# Patient Record
Sex: Female | Born: 1955 | Hispanic: No | Marital: Married | Smoking: Former smoker
Health system: Southern US, Community
[De-identification: ages and names within clinical notes are randomized; demographics above are authoritative.]

## PROBLEM LIST (undated history)

## (undated) DIAGNOSIS — M1990 Unspecified osteoarthritis, unspecified site: Secondary | ICD-10-CM

## (undated) DIAGNOSIS — E119 Type 2 diabetes mellitus without complications: Secondary | ICD-10-CM

## (undated) DIAGNOSIS — I10 Essential (primary) hypertension: Secondary | ICD-10-CM

## (undated) DIAGNOSIS — H269 Unspecified cataract: Secondary | ICD-10-CM

## (undated) HISTORY — PX: WISDOM TOOTH EXTRACTION: SHX21

## (undated) HISTORY — DX: Type 2 diabetes mellitus without complications: E11.9

## (undated) HISTORY — PX: DILATION AND CURETTAGE OF UTERUS: SHX78

## (undated) HISTORY — DX: Unspecified cataract: H26.9

## (undated) HISTORY — DX: Unspecified osteoarthritis, unspecified site: M19.90

## (undated) HISTORY — DX: Essential (primary) hypertension: I10

---

## 1999-05-31 ENCOUNTER — Other Ambulatory Visit: Admission: RE | Admit: 1999-05-31 | Discharge: 1999-05-31 | Payer: Self-pay | Admitting: Obstetrics and Gynecology

## 2000-09-04 ENCOUNTER — Other Ambulatory Visit: Admission: RE | Admit: 2000-09-04 | Discharge: 2000-09-04 | Payer: Self-pay | Admitting: Obstetrics and Gynecology

## 2001-09-15 ENCOUNTER — Other Ambulatory Visit: Admission: RE | Admit: 2001-09-15 | Discharge: 2001-09-15 | Payer: Self-pay | Admitting: Obstetrics and Gynecology

## 2002-10-02 ENCOUNTER — Other Ambulatory Visit: Admission: RE | Admit: 2002-10-02 | Discharge: 2002-10-02 | Payer: Self-pay | Admitting: Obstetrics and Gynecology

## 2003-10-13 ENCOUNTER — Other Ambulatory Visit: Admission: RE | Admit: 2003-10-13 | Discharge: 2003-10-13 | Payer: Self-pay | Admitting: Obstetrics and Gynecology

## 2005-01-11 ENCOUNTER — Other Ambulatory Visit: Admission: RE | Admit: 2005-01-11 | Discharge: 2005-01-11 | Payer: Self-pay | Admitting: Obstetrics and Gynecology

## 2006-04-04 ENCOUNTER — Other Ambulatory Visit: Admission: RE | Admit: 2006-04-04 | Discharge: 2006-04-04 | Payer: Self-pay | Admitting: Obstetrics and Gynecology

## 2007-12-25 HISTORY — PX: OTHER SURGICAL HISTORY: SHX169

## 2008-01-06 ENCOUNTER — Encounter (INDEPENDENT_AMBULATORY_CARE_PROVIDER_SITE_OTHER): Payer: Self-pay | Admitting: Obstetrics and Gynecology

## 2008-01-06 ENCOUNTER — Ambulatory Visit (HOSPITAL_COMMUNITY): Admission: RE | Admit: 2008-01-06 | Discharge: 2008-01-07 | Payer: Self-pay | Admitting: Physical Therapy

## 2008-04-23 ENCOUNTER — Ambulatory Visit: Payer: Self-pay | Admitting: Internal Medicine

## 2008-04-23 HISTORY — PX: COLONOSCOPY: SHX174

## 2008-05-06 ENCOUNTER — Ambulatory Visit: Payer: Self-pay | Admitting: Internal Medicine

## 2008-10-20 IMAGING — CR DG IVP W/O TOMOGRAMS
5 series · 5 of 5 positions shown · IV contrast (omnipaque)
Comparison: none

CLINICAL DATA: Status post vaginal hysterectomy; left pelvic pain.
 IVP:
TECHNIQUE: 100 cc of Omnipaque.

[view not recorded (1 of 5)]
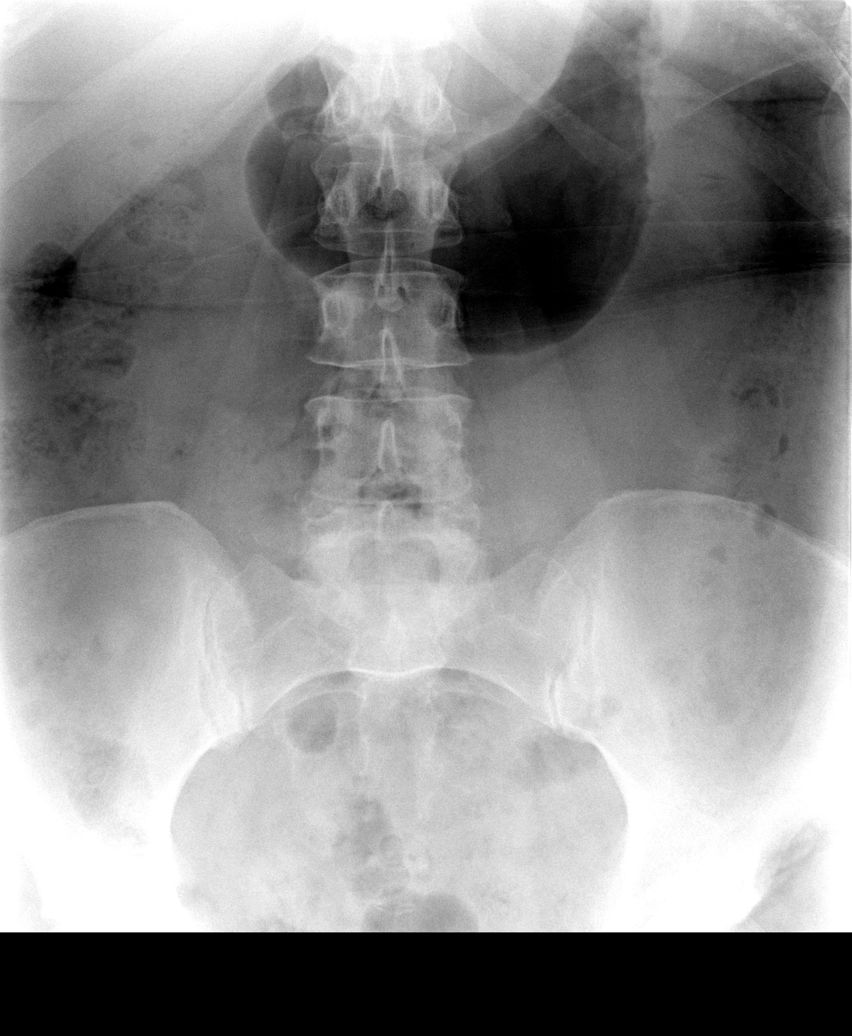

[view not recorded (2 of 5)]
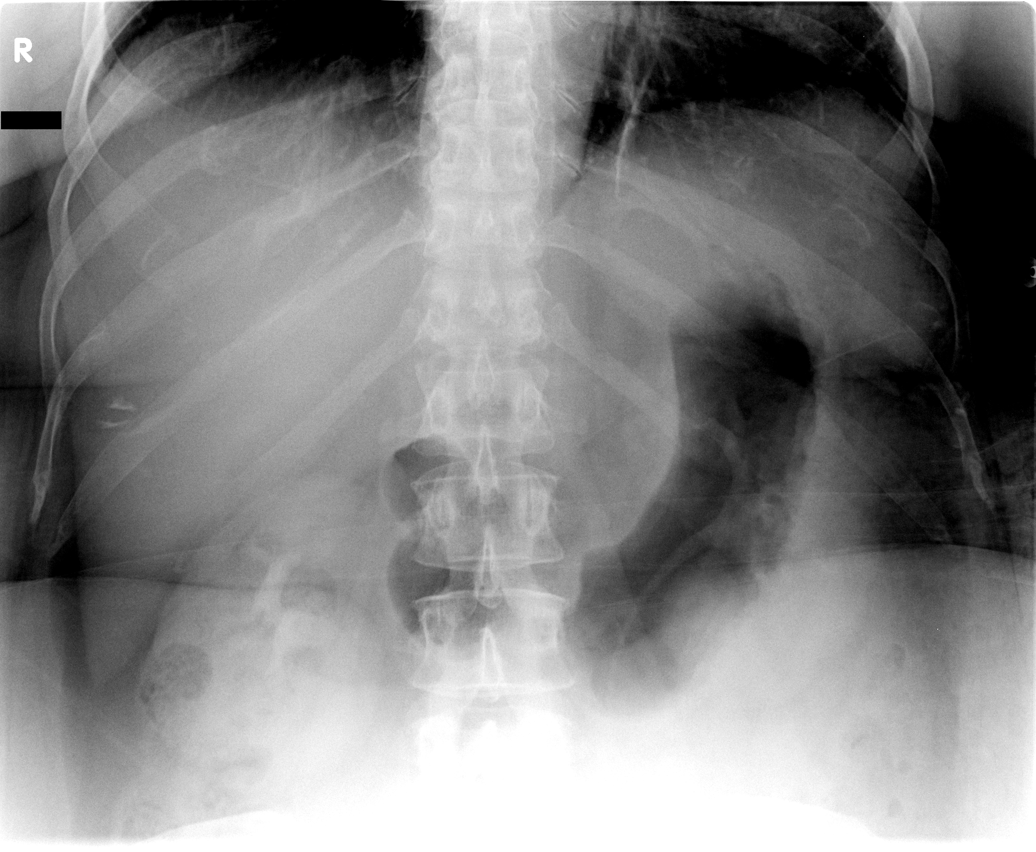

[view not recorded (3 of 5)]
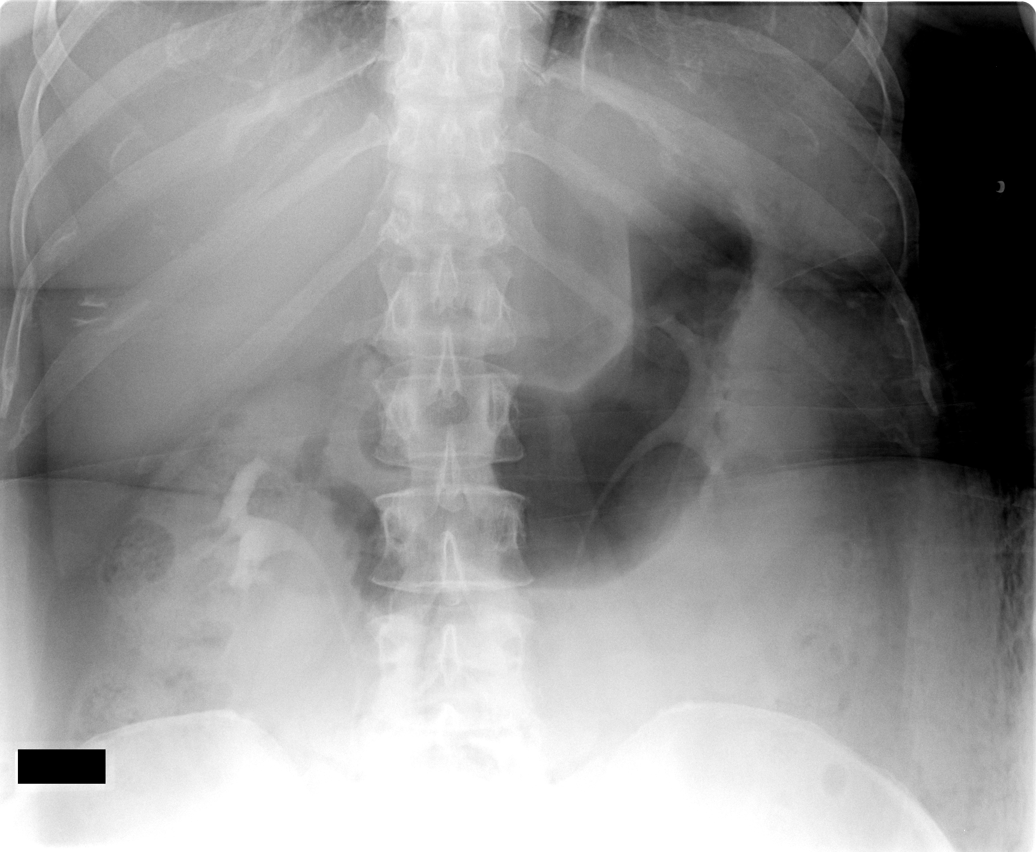

[view not recorded (4 of 5)]
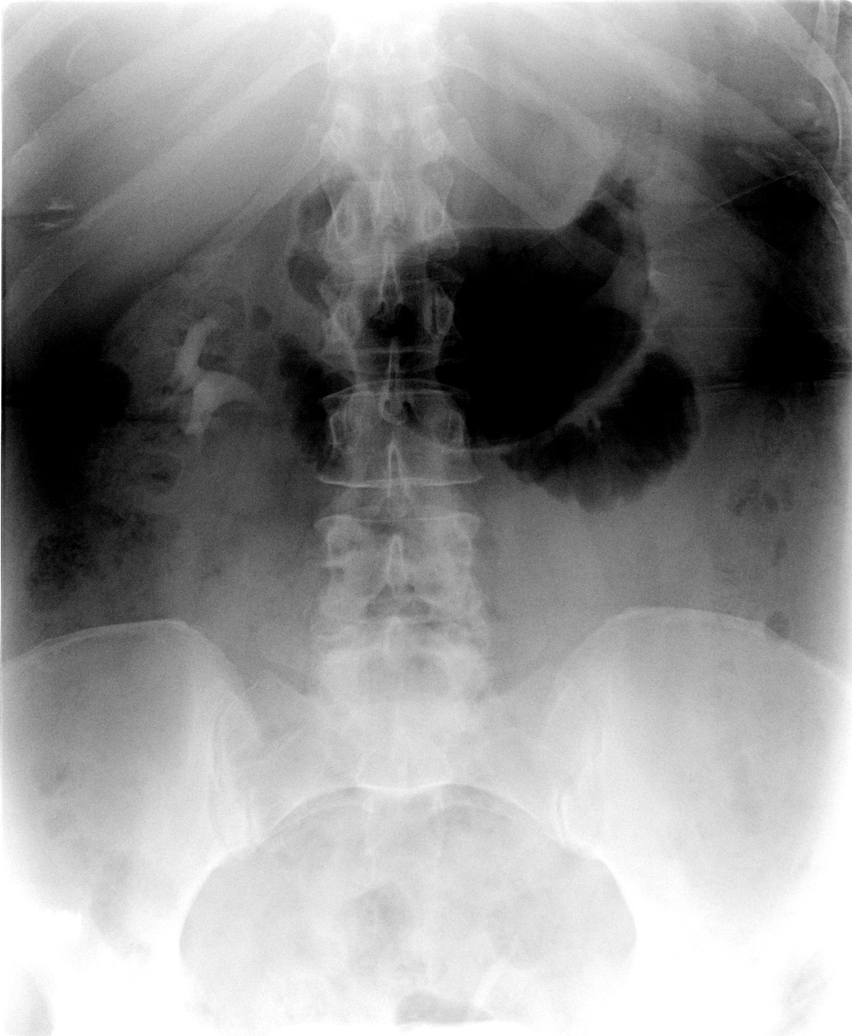

[view not recorded (5 of 5)]
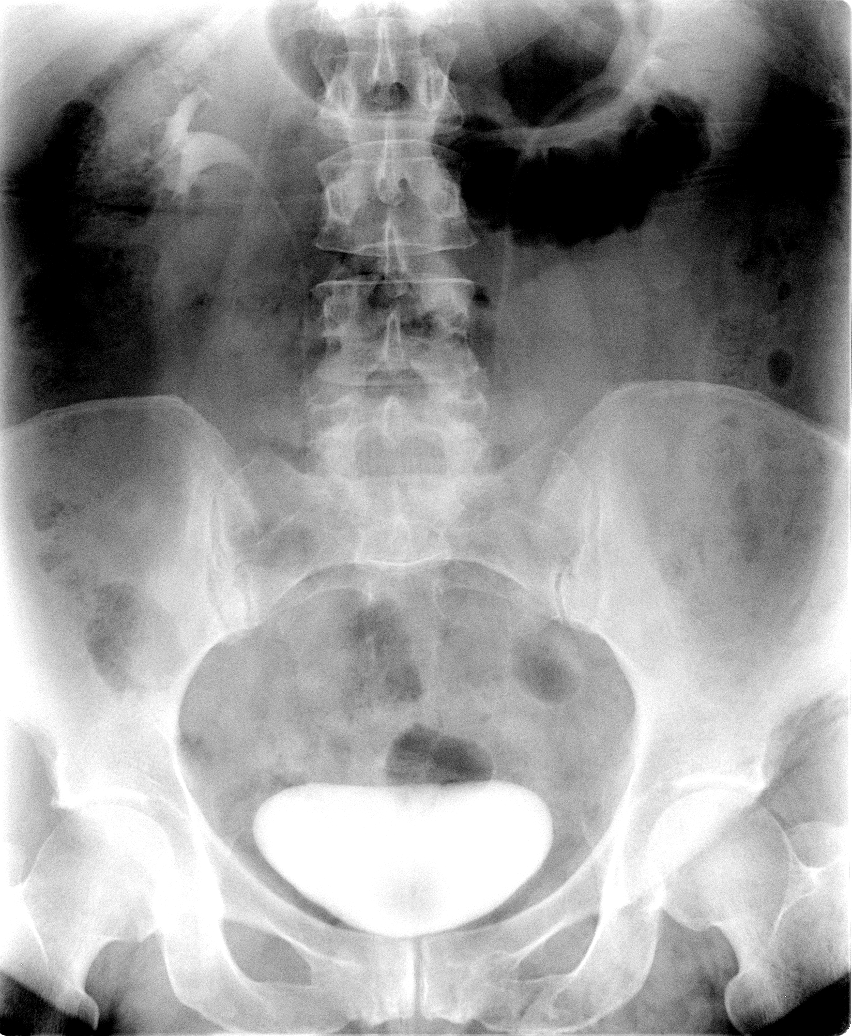

[5 of 5 positions shown; findings below may reference images not displayed]

FINDINGS: Scout film reveals normal stool and bowel gas pattern.  On the immediate film, prompt symmetric nephrograms are seen.  There is prompt excretion of contrast in both upper collecting systems.  The 5 and 10 minute delayed films also show symmetric renal pelves and segmentally visualized ureters with no evidence of dilatation.  No gross extravasation of contrast is seen.
IMPRESSION: Normal IVP with no evidence of gross obstruction or extravasation.

## 2009-12-02 ENCOUNTER — Encounter: Admission: RE | Admit: 2009-12-02 | Discharge: 2009-12-02 | Payer: Self-pay | Admitting: Obstetrics and Gynecology

## 2009-12-15 ENCOUNTER — Encounter: Admission: RE | Admit: 2009-12-15 | Discharge: 2009-12-15 | Payer: Self-pay | Admitting: Obstetrics and Gynecology

## 2010-09-29 IMAGING — MG MM BREAST STEREO BIOPSY*R*
2 series · 2 of 2 positions shown · non-contrast
Comparison: none

December 19, 2009 –DUPLICATE COPY for exam association in [facility]. No change from original report.
 Addendum Begins

 Addendum dictated by Dr. Lisa on 12/19/2009. The original report
 was dictated by Dr. Asenathi.
 The final pathological diagnosis is benign fibroadenomatoid
 proliferation associated with calcification. This is concordant
 with the imaging findings. The biopsy results were given to the
 patient and her husband on 12/19/2009. Their questions were
 answered. The biopsy site is clean and dry without bruising or
 palpable hematoma. The patient reported no residual pain at the
 biopsy site. Screening mammography is recommended in 1 year.
 Addendum Ends
CLINICAL DATA: Right breast microcalcifications.

[R CC]
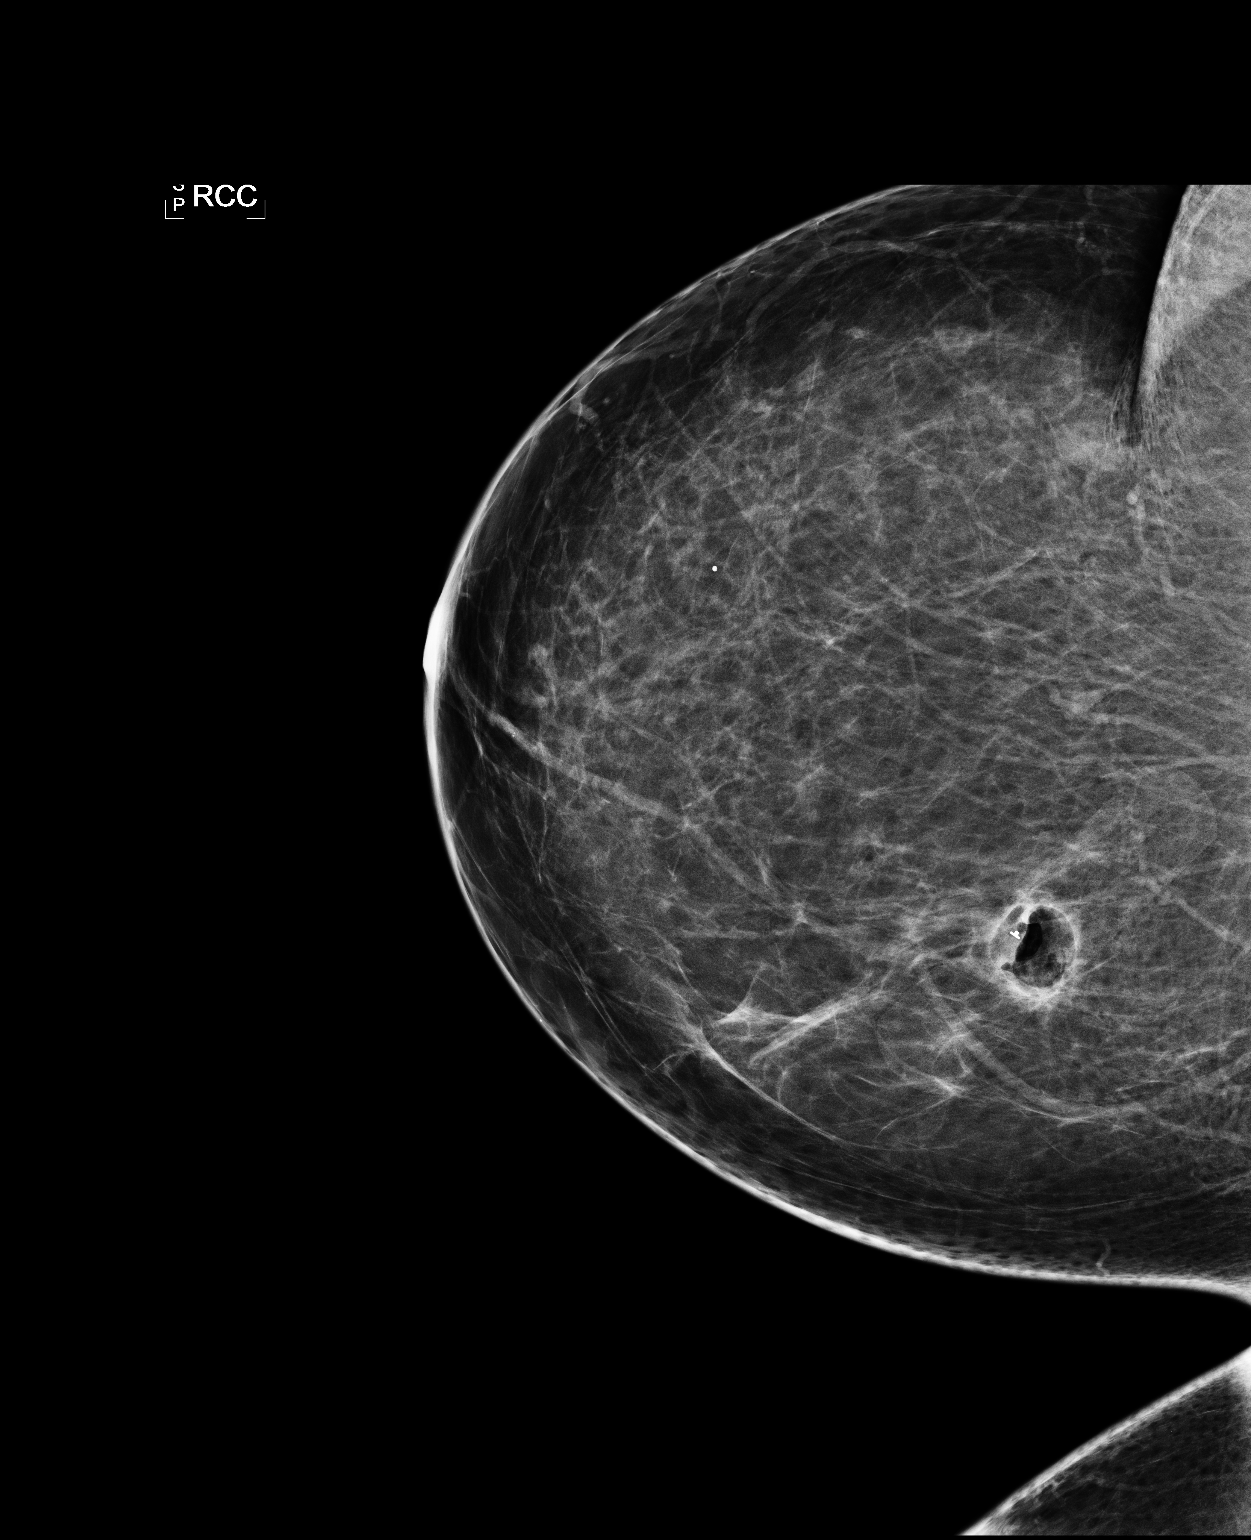

[R ML]
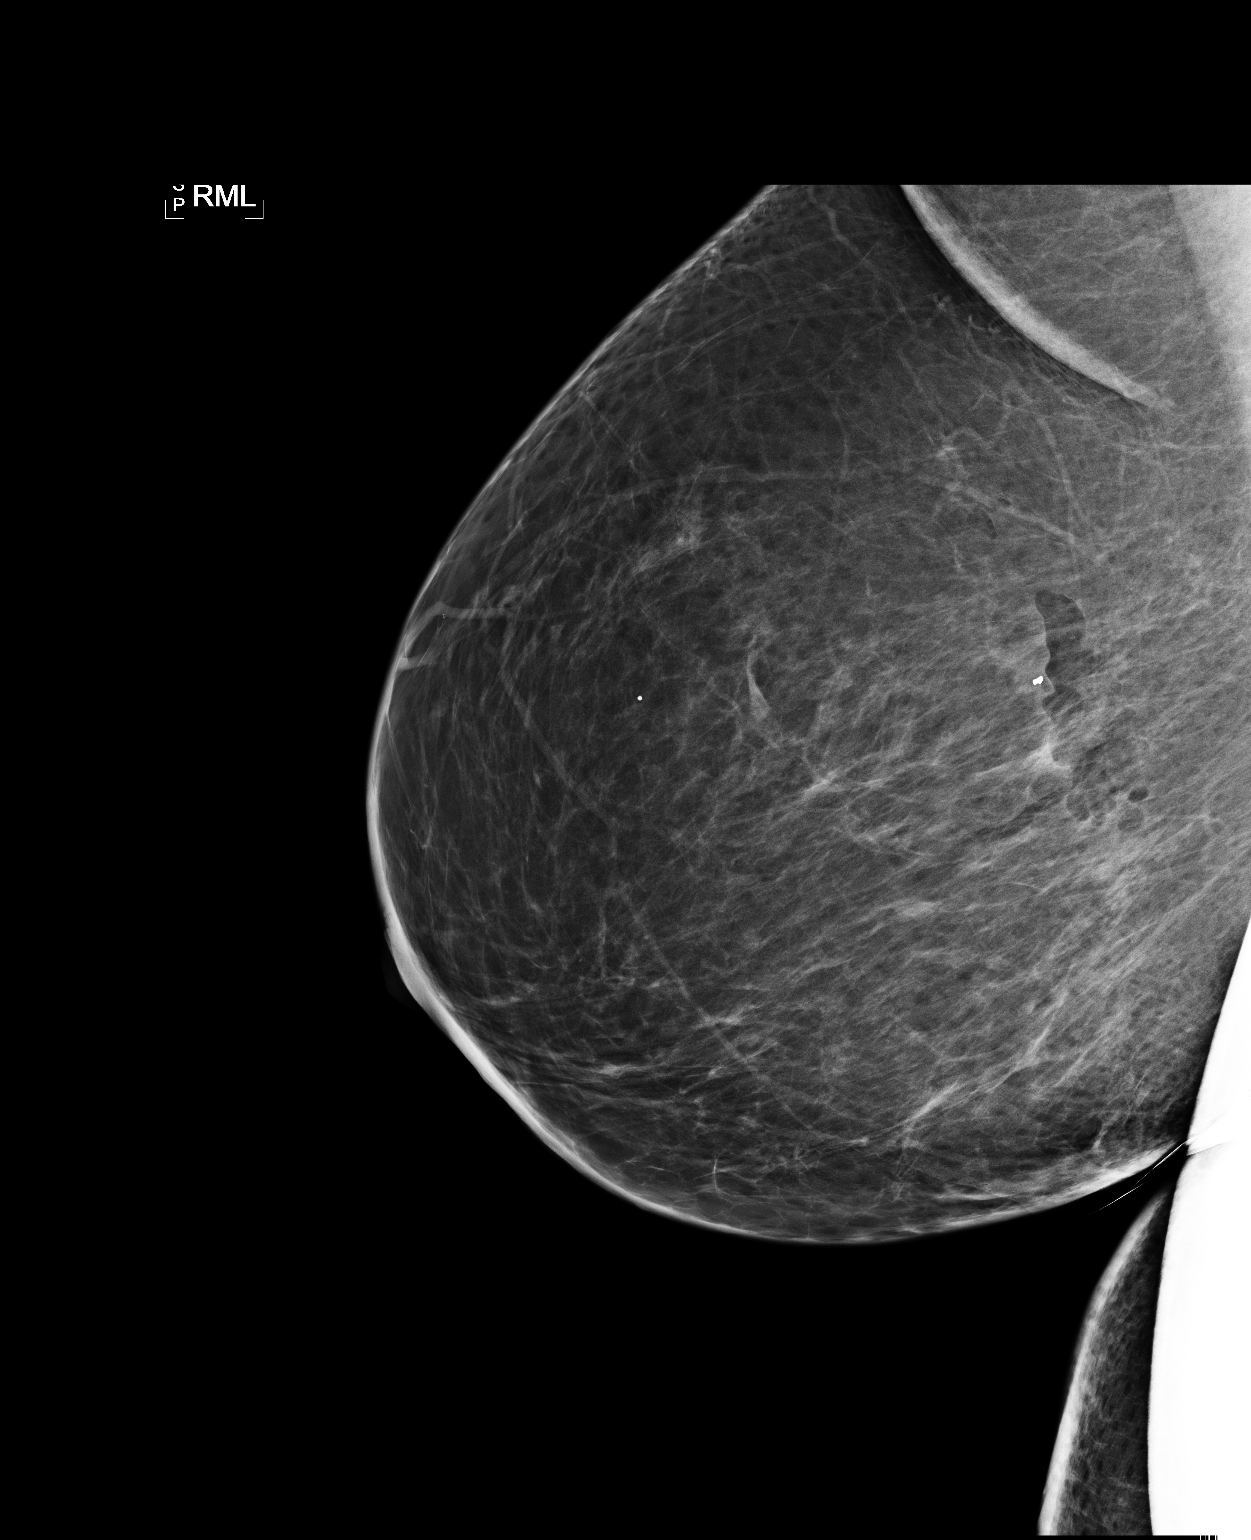

[2 of 2 positions shown; findings below may reference images not displayed]

STEREOTACTIC-GUIDED VACUUM ASSISTED BIOPSY OF THE RIGHT BREAST AND
 SPECIMEN RADIOGRAPH

 I met with the patient, and we discussed the procedure of
 stereotactic-guided biopsy, including risks, benefits, and
 alternatives. Specifically, we discussed the risks of infection,
 bleeding, tissue injury, clip migration, and inadequate sampling.
 Informed, written consent was given.

 Using sterile technique, 2% lidocaine, stereotactic guidance, and a
 9 gauge vacuum assisted device, biopsy was performed of the small
 cluster of calcifications located centrally and slightly medially
 within the right breast. Specimen radiograph was performed,
 showing multiple calcifications within the specimen. Specimens
 with calcifications are identified for pathology.

 At the conclusion of the procedure, a tissue marker clip was
 deployed into the biopsy cavity. Follow-up 2-view mammogram
 confirmed clip to be in satisfactory position.
IMPRESSION: Stereotactic-guided biopsy of right breast microcalcifications as
 discussed above. No apparent complications.

## 2011-05-08 NOTE — H&P (Signed)
NAME:  Norma Shaffer, Norma Shaffer NO.:  [id number]   MEDICAL RECORD NO.:  [id number]          PATIENT TYPE:  AMB   LOCATION:  SDC                           FACILITY:  [facility]   PHYSICIAN:  Dineen Kid. Rana Snare, M.D.    DATE OF BIRTH:  12/07/1956   DATE OF ADMISSION:  01/06/2008  DATE OF DISCHARGE:                              HISTORY & PHYSICAL   HISTORY OF PRESENT ILLNESS:  Norma Shaffer is a 55 year old G1, P1 with  problems with abnormal uterine bleeding and postmenopausal bleeding.  She had a recent hysteroscopy D&C for evaluation and removal of  endometrial polyp in September which showed simple hyperplasia without  atypia.  Subsequently was placed on Provera.  Her surgery was  uncomplicated.  After beginning on Provera, she began having irregular  and heavy bleeding, which really has not stopped since.  She has been  tried even on birth control pills, and it can slow with two birth  control pills at a time but otherwise she cannot stop her birth control  pills without resumption of the bleeding.  Had a recent follow-up saline  infusion ultrasound this January, which shows several fibroids, the  largest measuring 2.9 cm in size and some suspicion for adenomyosis and  some irregular endometrium, suspicious for endometrial clot.  She  desires definitive surgical intervention and requests a hysterectomy and  presents today for this.   PAST MEDICAL/SURGICAL HISTORY:  Significant for cesarean section for  failure to progress of an 8 pound, 13 ounce baby.  She has had a  question of borderline hypertension, not treated.  Does have a family  history of endometrial cancer.   MEDICATIONS:  Loestrin 24.  She is taking 2 of those a day.   PHYSICAL EXAMINATION:  Blood pressure 130/94.  HEART:  Regular rate and rhythm.  LUNGS:  Clear to auscultation bilaterally.  ABDOMEN:  Nondistended, nontender.  PELVIC:  Uterus is anteverted, mobile, nontender.  Cervix appears to be  normal.   IMPRESSION/PLAN:  1. Postmenopausal bleeding, abnormal bleeding, not controlled with      birth control pills.  2. History of hyperplasia without atypia, which was in a polyp that      has been removed.  She also has fibroids and a question of      adenomyosis.  I discussed the options at length with Norma Shaffer.  At the      very least, I recommended a hysteroscopy and D&C for evaluation of      the endometrial lining.  Having had that done recently and feel      that the bleeding most likely will not improve because it all      started after beginning her recently on Provera.  Most likely, the      bleeding is more related to adenomyosis or fibroids.  She want to      proceed with hysterectomy.  Discussed different forms such as total      abdominal hysterectomy versus laparoscopic-assisted vaginal      hysterectomy.  Due to the fact that she has had  a cesarean section      in the past, she does want to attempt at laparoscopic-assisted      vaginal hysterectomy with bilateral salpingo-oophorectomy.  I      discussed the risks and benefits of the procedure at length, which      include but are not limited to risks of infection, bleeding, damage      to      bowel, bladder, ureters, ovaries, risks associated with anesthesia,      risks associated with blood transfusion, possibility that there is      something malignant or pre-malignant, it could require further      surgery in the future for staging.  She does give informed consent      and wishes to proceed.      Dineen Kid Rana Snare, M.D.  Electronically Signed     DCL/MEDQ  D:  01/05/2008  T:  01/05/2008  Job:  [id number]

## 2011-05-08 NOTE — Op Note (Signed)
NAME:  [name], [name] NO.:  [id number]   MEDICAL RECORD NO.:  [id number]          PATIENT TYPE:  OIB   LOCATION:  9318                          FACILITY:  [facility]   PHYSICIAN:  Dineen Kid. Rana Snare, M.D.    DATE OF BIRTH:  May 19, 1956   DATE OF PROCEDURE:  01/06/2008  DATE OF DISCHARGE:                               OPERATIVE REPORT   PREOPERATIVE DIAGNOSES:  1. Postmenopausal bleeding.  2. Menometrorrhagia.  3. Fibroids.   POSTOPERATIVE DIAGNOSES:  1. Postmenopausal bleeding.  2. Menometrorrhagia.  3. Fibroids.   PROCEDURE:  Laparoscopic-assisted vaginal hysterectomy with bilateral  salpingo-oophorectomy.   SURGEON:  Dr. Candice Camp.   ASSISTANT:  Dr. Konrad Dolores.   ANESTHESIA:  General endotracheal.   INDICATIONS:  [name] is a 55 year old, G1, P1 with worsening abnormal  uterine bleeding, postmenopausal bleeding not controlled with birth  control pills or recent hysteroscopy D&C.  She had a D&C in September  which showed simple hyperplasia without atypia. Attempts at controlling  the bleeding were unsuccessful with hormonal therapy. Ultrasound  suspicious for several fibroids as well as adenomyosis. She desires  definitive surgical intervention and presents for LAVH with BSO. The  risks of the procedure were discussed at length.  Informed consent was  obtained.  See history and physical for further details.   FINDINGS:  Slightly enlarged uterus, adhesions from the left ovary to  the pelvic sidewall.  Otherwise normal-appearing ovaries, appendix,  liver, cul-de-sac and uterus other than the uterus being approximately 8-  week size and globular.   DESCRIPTION OF PROCEDURE:  After adequate analgesia, the patient was  placed in the dorsal lithotomy position.  She was sterilely prepped and  draped, the bladder sterilely drained.  A Graves speculum was placed, a  Hulka tenaculum was placed in the cervix. A 1 cm infraumbilical skin  incision was then made, a Veress  needle was inserted.  The abdomen was  insufflated, dullness to percussion. An 11 mm trocar was inserted.  The  above findings were noted by laparoscope.  The 5-mm trocar was inserted  to the left of the midline two fingerbreadths above the pubic symphysis  under direct visualization. Gyrus cutting forceps were used to identify,  the ligate and dissect the right round ligament and also the right  infundibulopelvic ligament down to the inferior portion of the broad  ligament, care taken to avoid the underlying ureter and care taken to  achieve good hemostasis. The left ovary was identified and dissected  using the cutting grasping forceps from the pelvic sidewall and then  dissecting  across the infundibulopelvic ligament as well as the round  ligament to the inferior portion of broad ligament, care again taken to  achieve good hemostasis, but stayed close to the ovary so as not to  disturb or come near the ureter.  The bladder was then elevated and an  incision was made with the cutting forceps at the uterovesical junction  creating a small bladder flap.  The abdomen was then desufflated, legs  repositioned.  A weighted speculum was placed in  the vagina and a Jacobs  tenaculum was placed in the cervix.  A posterior colpotomy was  performed, cervix was circumscribed with Bovie cautery.  A LigaSure  instrument was used to ligate across the uterosacral ligaments  bilaterally, the cardinal ligaments bilaterally and the bladder pillars  bilaterally and dissected with Mayo scissors.  The vagina was dissected  off the anterior surface of the cervix.  A Deaver retractor was placed  underneath the bladder. The inferior portion of the broad ligament was  then ligated with the LigaSure, dissected with the Mayo scissors.  The  uterus, fallopian tubes and ovaries were removed intact.  The  uterosacral ligaments were identified bilaterally,  ligated with #0  Monocryl suture in a figure-of-eight fashion.   The posterior peritoneum  was then closed with #0 Monocryl in a pursestring fashion.  The vagina  was then closed in a vertical fashion using figure-of-eights of #0  Monocryl suture with good approximation and good hemostasis achieved.  The Foley catheter was then placed with return of clear yellow urine.  The legs repositioned, abdomen reinsufflated. After a copious  amount of  irrigation, adequate hemostasis was assured. On examination of the cul-  de-sac, we were unable to identify the ureter due to the adhesions that  had been dissected but no obvious trauma to the pelvic sidewalls was  noted.  Care had been taken to avoid the area for which the ureter  resides. The abdomen was then desufflated, the trocars removed.  The  umbilical incision was closed with 3-0 Vicryl Rapide subcuticular  suture.  The 5-mm site was closed with 3-0 Vicryl Rapide subcuticular  suture, the incision was injected with 0.25% Marcaine, a  total of 10 mL  used.  The patient was transferred to the recovery room in stable  condition.  Sponge and instrument count was normal x3.  Estimated blood  loss 150 mL.  The patient received 1 gram of cefotetan preoperatively.  I will continue her on this for 24 hours due to the fact that she is an  adult onset diabetic and obese. I will also have them check an IVP in  the post anesthetic recovery unit to assure no constriction of the left  ureter before transferring her to the room.      Dineen Kid Rana Snare, M.D.  Electronically Signed     DCL/MEDQ  D:  01/06/2008  T:  01/06/2008  Job:  [id number]

## 2011-05-08 NOTE — Discharge Summary (Signed)
NAME:  Norma Shaffer, BILLITER NO.:  [id number]   MEDICAL RECORD NO.:  [id number]          PATIENT TYPE:  OIB   LOCATION:  9318                          FACILITY:  [facility]   PHYSICIAN:  Dineen Kid. Rana Snare, M.D.    DATE OF BIRTH:  09-Apr-1956   DATE OF ADMISSION:  01/06/2008  DATE OF DISCHARGE:  01/07/2008                               DISCHARGE SUMMARY   HISTORY OF PRESENT ILLNESS:  Ms. Millon is a 55 year old G1, P1 with  abnormal uterine bleeding and postmenopausal bleeding.  She underwent  hysteroscopy, D and C for evaluation and removal of endometrial polyp in  September which showed simple hyperplasia without atypia.  She was  subsequently placed on Provera and began having bleeding since then that  has not really stopped.  She has even been tried on birth control pills  with no success, only slowing it down, and that being on 2 birth control  pills a day.  She underwent recent saline infusion ultrasound, showing  several fibroids, the largest measuring 2.9 cm in size and also  suspicion for adenomyosis.  She desires surgical intervention and  presents for laparoscopic-assisted vaginal hysterectomy with bilateral  salping-oophorectomy.   HOSPITAL COURSE:  The patient underwent an LAVH-BSO which was  uncomplicated.  She did have some scarring in the left adnexa area, but  the ovaries were completely removed.  The patient had 150 mL blood loss  at surgery and underwent a postoperative IVP to reassure that there was  no constriction of the ureters and IVP was normal.   Her postoperative course was unremarkable with good return of bowel  function.  By postop day #1 she was ambulating without difficulty and  tolerating a regular diet.  She remained afebrile throughout her  hospital course.  Her postoperative hemoglobin was 10.0.  Her incisions  were clean, dry, and intact with normoactive bowel sounds, and the  patient was discharged home.   DISPOSITION:  The patient will be  discharged home, will follow up in the  office in 2-3 weeks, and offered routine instruction sheet for  hysterectomy.  Told to return for increased pain, fever, or bleeding.  She is also given a prescription for oxycodone #30 to take as needed.      Dineen Kid Rana Snare, M.D.  Electronically Signed     DCL/MEDQ  D:  01/07/2008  T:  01/07/2008  Job:  [id number]

## 2011-09-13 LAB — CBC
HCT: 29.9 — ABNORMAL LOW
HCT: 34 — ABNORMAL LOW
Hemoglobin: 10 — ABNORMAL LOW
MCHC: 33.8
MCV: 83.6
RBC: 3.59 — ABNORMAL LOW
RBC: 4.07
WBC: 10.3
WBC: 10.3

## 2011-09-13 LAB — COMPREHENSIVE METABOLIC PANEL
CO2: 25
Calcium: 8.8
Creatinine, Ser: 0.69
GFR calc non Af Amer: >60
Glucose, Bld: 202 — ABNORMAL HIGH
Total Protein: 6.9

## 2014-01-27 ENCOUNTER — Other Ambulatory Visit: Payer: Self-pay | Admitting: Obstetrics and Gynecology

## 2015-10-05 ENCOUNTER — Encounter: Payer: Self-pay | Admitting: Internal Medicine

## 2018-04-01 ENCOUNTER — Encounter: Payer: Self-pay | Admitting: Internal Medicine

## 2018-07-02 DIAGNOSIS — Z0184 Encounter for antibody response examination: Secondary | ICD-10-CM | POA: Diagnosis not present

## 2018-07-02 DIAGNOSIS — E1129 Type 2 diabetes mellitus with other diabetic kidney complication: Secondary | ICD-10-CM | POA: Diagnosis not present

## 2018-07-02 DIAGNOSIS — E782 Mixed hyperlipidemia: Secondary | ICD-10-CM | POA: Diagnosis not present

## 2018-07-11 DIAGNOSIS — E1129 Type 2 diabetes mellitus with other diabetic kidney complication: Secondary | ICD-10-CM | POA: Diagnosis not present

## 2018-07-11 DIAGNOSIS — R809 Proteinuria, unspecified: Secondary | ICD-10-CM | POA: Diagnosis not present

## 2018-09-15 ENCOUNTER — Encounter: Payer: Self-pay | Admitting: Gastroenterology

## 2018-10-13 ENCOUNTER — Ambulatory Visit (AMBULATORY_SURGERY_CENTER): Payer: 59 | Admitting: *Deleted

## 2018-10-13 ENCOUNTER — Other Ambulatory Visit: Payer: Self-pay

## 2018-10-13 ENCOUNTER — Encounter: Payer: Self-pay | Admitting: Gastroenterology

## 2018-10-13 VITALS — Ht 66.0 in | Wt 263.4 lb

## 2018-10-13 DIAGNOSIS — Z1211 Encounter for screening for malignant neoplasm of colon: Secondary | ICD-10-CM

## 2018-10-13 MED ORDER — PEG-KCL-NACL-NASULF-NA ASC-C 140 G PO SOLR: 1.0000 | Freq: Once | ORAL | 0 refills | Status: AC

## 2018-10-13 NOTE — Progress Notes (Signed)
Patient denies any allergies to egg or soy products. Patient denies complications with anesthesia/sedation.  Patient denies oxygen use at home and denies diet medications. Patient denies information on colonoscopy. 

## 2018-10-27 ENCOUNTER — Encounter: Payer: Self-pay | Admitting: Gastroenterology

## 2018-10-27 ENCOUNTER — Ambulatory Visit (AMBULATORY_SURGERY_CENTER): Payer: 59 | Admitting: Gastroenterology

## 2018-10-27 VITALS — BP 127/67 | HR 93 | Temp 97.3°F | Resp 17 | Ht 66.0 in | Wt 263.0 lb

## 2018-10-27 DIAGNOSIS — Z1211 Encounter for screening for malignant neoplasm of colon: Secondary | ICD-10-CM | POA: Diagnosis not present

## 2018-10-27 DIAGNOSIS — Z86010 Personal history of colonic polyps: Secondary | ICD-10-CM | POA: Diagnosis not present

## 2018-10-27 MED ORDER — SODIUM CHLORIDE 0.9 % IV SOLN: 500.0000 mL | INTRAVENOUS | Status: DC

## 2018-10-27 NOTE — Patient Instructions (Signed)
YOU HAD AN ENDOSCOPIC PROCEDURE TODAY AT THE  ENDOSCOPY CENTER:   Refer to the procedure report that was given to you for any specific questions about what was found during the examination.  If the procedure report does not answer your questions, please call your gastroenterologist to clarify.  If you requested that your care partner not be given the details of your procedure findings, then the procedure report has been included in a sealed envelope for you to review at your convenience later.  YOU SHOULD EXPECT: Some feelings of bloating in the abdomen. Passage of more gas than usual.  Walking can help get rid of the air that was put into your GI tract during the procedure and reduce the bloating. If you had a lower endoscopy (such as a colonoscopy or flexible sigmoidoscopy) you may notice spotting of blood in your stool or on the toilet paper. If you underwent a bowel prep for your procedure, you may not have a normal bowel movement for a few days.  Please Note:  You might notice some irritation and congestion in your nose or some drainage.  This is from the oxygen used during your procedure.  There is no need for concern and it should clear up in a day or so.  SYMPTOMS TO REPORT IMMEDIATELY:   Following lower endoscopy (colonoscopy or flexible sigmoidoscopy):  Excessive amounts of blood in the stool  Significant tenderness or worsening of abdominal pains  Swelling of the abdomen that is new, acute  Fever of 100F or higher   Following upper endoscopy (EGD)  Vomiting of blood or coffee ground material  New chest pain or pain under the shoulder blades  Painful or persistently difficult swallowing  New shortness of breath  Fever of 100F or higher  Black, tarry-looking stools  For urgent or emergent issues, a gastroenterologist can be reached at any hour by calling [phone].   DIET:  We do recommend a small meal at first, but then you may proceed to your regular diet.  Drink  plenty of fluids but you should avoid alcoholic beverages for 24 hours.  ACTIVITY:  You should plan to take it easy for the rest of today and you should NOT DRIVE or use heavy machinery until tomorrow (because of the sedation medicines used during the test).    FOLLOW UP: Our staff will call the number listed on your records the next business day following your procedure to check on you and address any questions or concerns that you may have regarding the information given to you following your procedure. If we do not reach you, we will leave a message.  However, if you are feeling well and you are not experiencing any problems, there is no need to return our call.  We will assume that you have returned to your regular daily activities without incident.  If any biopsies were taken you will be contacted by phone or by letter within the next 1-3 weeks.  Please call us at [phone] if you have not heard about the biopsies in 3 weeks.    SIGNATURES/CONFIDENTIALITY: You and/or your care partner have signed paperwork which will be entered into your electronic medical record.  These signatures attest to the fact that that the information above on your After Visit Summary has been reviewed and is understood.  Full responsibility of the confidentiality of this discharge information lies with you and/or your care-partner. 

## 2018-10-27 NOTE — Progress Notes (Signed)
Report given to PACU, vss 

## 2018-10-27 NOTE — Op Note (Signed)
[facility] Patient Name: Norma Shaffer Procedure Date: 10/27/2018 7:59 AM MRN: [id number] Endoscopist: Sherilyn Cooter L. Myrtie Neither , MD Age: 62 Referring MD:  Date of Birth: 08-29-1956 Gender: Female Account #: [id number] Procedure:                Colonoscopy Indications:              Screening for colorectal malignant neoplasm (no                            polyps 04/2008) Medicines:                Monitored Anesthesia Care Procedure:                Pre-Anesthesia Assessment:                           - Prior to the procedure, a History and Physical                            was performed, and patient medications and                            allergies were reviewed. The patient's tolerance of                            previous anesthesia was also reviewed. The risks                            and benefits of the procedure and the sedation                            options and risks were discussed with the patient.                            All questions were answered, and informed consent                            was obtained. Anticoagulants: The patient has taken                            aspirin. It was decided not to withhold this                            medication prior to the procedure. ASA Grade                            Assessment: II - A patient with mild systemic                            disease. After reviewing the risks and benefits,                            the patient was deemed in satisfactory condition to  undergo the procedure.                           After obtaining informed consent, the colonoscope                            was passed under direct vision. Throughout the                            procedure, the patient's blood pressure, pulse, and                            oxygen saturations were monitored continuously. The                            Colonoscope was introduced through the anus and                             advanced to the the cecum, identified by                            appendiceal orifice and ileocecal valve. The                            colonoscopy was performed with difficulty due to a                            redundant colon and significant looping. Successful                            completion of the procedure was aided by using                            manual pressure. The patient tolerated the                            procedure fairly well. The quality of the bowel                            preparation was excellent. The quality of the bowel                            preparation was evaluated using the BBPS [facility]                            Bowel Preparation Scale) with scores of: Right                            Colon = 2, Transverse Colon = 3 and Left Colon = 3.                            The total BBPS score equals 8. Scope In: 8:08:10 AM Scope Out: 8:30:35 AM Scope Withdrawal Time: 0 hours 9 minutes 21  seconds  Total Procedure Duration: 0 hours 22 minutes 25 seconds  Findings:                 The perianal and digital rectal examinations were                            normal.                           The colon (entire examined portion) was                            significantly redundant.                           The exam was otherwise without abnormality on                            direct and retroflexion views. Complications:            No immediate complications. Estimated Blood Loss:     Estimated blood loss: none. Impression:               - Redundant colon.                           - The examination was otherwise normal on direct                            and retroflexion views.                           - No specimens collected. Recommendation:           - Patient has a contact number available for                            emergencies. The signs and symptoms of potential                            delayed complications were discussed with the                             patient. Return to normal activities tomorrow.                            Written discharge instructions were provided to the                            patient.                           - Resume previous diet.                           - Continue present medications.                           - Repeat colonoscopy in 10  years for screening                            purposes.  L. Myrtie Neither, MD 10/27/2018 8:36:34 AM This report has been signed electronically.

## 2018-10-28 ENCOUNTER — Telehealth: Payer: Self-pay

## 2018-10-28 NOTE — Telephone Encounter (Signed)
  Follow up Call-  Call back number 10/27/2018  Post procedure Call Back phone  # [phone]  Permission to leave phone message Yes  Some recent data might be hidden     Patient questions:  Do you have a fever, pain , or abdominal swelling? No. Pain Score  0 *  Have you tolerated food without any problems? Yes.    Have you been able to return to your normal activities? Yes.    Do you have any questions about your discharge instructions: Diet   No. Medications  No. Follow up visit  No.  Do you have questions or concerns about your Care? No.  Actions: * If pain score is 4 or above: No action needed, pain <4.

## 2018-12-25 DIAGNOSIS — Z6839 Body mass index (BMI) 39.0-39.9, adult: Secondary | ICD-10-CM | POA: Diagnosis not present

## 2018-12-25 DIAGNOSIS — Z13820 Encounter for screening for osteoporosis: Secondary | ICD-10-CM | POA: Diagnosis not present

## 2018-12-25 DIAGNOSIS — Z01419 Encounter for gynecological examination (general) (routine) without abnormal findings: Secondary | ICD-10-CM | POA: Diagnosis not present

## 2018-12-25 DIAGNOSIS — Z1231 Encounter for screening mammogram for malignant neoplasm of breast: Secondary | ICD-10-CM | POA: Diagnosis not present

## 2020-05-17 ENCOUNTER — Other Ambulatory Visit: Payer: Self-pay | Admitting: Obstetrics and Gynecology

## 2020-05-17 DIAGNOSIS — R928 Other abnormal and inconclusive findings on diagnostic imaging of breast: Secondary | ICD-10-CM

## 2020-05-24 ENCOUNTER — Ambulatory Visit
Admission: RE | Admit: 2020-05-24 | Discharge: 2020-05-24 | Disposition: A | Discharge status: Home / Self Care | Payer: Medicare Other | Source: Ambulatory Visit | Attending: Obstetrics and Gynecology | Admitting: Obstetrics and Gynecology

## 2020-05-24 ENCOUNTER — Other Ambulatory Visit: Payer: Self-pay

## 2020-05-24 ENCOUNTER — Other Ambulatory Visit: Payer: Self-pay | Admitting: Obstetrics and Gynecology

## 2020-05-24 DIAGNOSIS — R928 Other abnormal and inconclusive findings on diagnostic imaging of breast: Secondary | ICD-10-CM

## 2020-05-24 DIAGNOSIS — N6320 Unspecified lump in the left breast, unspecified quadrant: Secondary | ICD-10-CM

## 2020-06-09 ENCOUNTER — Other Ambulatory Visit: Payer: Self-pay

## 2020-06-09 ENCOUNTER — Ambulatory Visit
Admission: RE | Admit: 2020-06-09 | Discharge: 2020-06-09 | Disposition: A | Discharge status: Home / Self Care | Payer: Medicare Other | Source: Ambulatory Visit | Attending: Obstetrics and Gynecology | Admitting: Obstetrics and Gynecology

## 2020-06-09 DIAGNOSIS — N6320 Unspecified lump in the left breast, unspecified quadrant: Secondary | ICD-10-CM

## 2022-02-01 DIAGNOSIS — H25013 Cortical age-related cataract, bilateral: Secondary | ICD-10-CM | POA: Diagnosis not present

## 2022-02-01 DIAGNOSIS — H40033 Anatomical narrow angle, bilateral: Secondary | ICD-10-CM | POA: Diagnosis not present

## 2022-02-01 DIAGNOSIS — H2513 Age-related nuclear cataract, bilateral: Secondary | ICD-10-CM | POA: Diagnosis not present

## 2022-02-01 DIAGNOSIS — H524 Presbyopia: Secondary | ICD-10-CM | POA: Diagnosis not present

## 2022-02-16 DIAGNOSIS — H40063 Primary angle closure without glaucoma damage, bilateral: Secondary | ICD-10-CM | POA: Diagnosis not present

## 2022-02-16 DIAGNOSIS — H25811 Combined forms of age-related cataract, right eye: Secondary | ICD-10-CM | POA: Diagnosis not present

## 2022-02-16 DIAGNOSIS — H25812 Combined forms of age-related cataract, left eye: Secondary | ICD-10-CM | POA: Diagnosis not present

## 2022-03-05 DIAGNOSIS — H25812 Combined forms of age-related cataract, left eye: Secondary | ICD-10-CM | POA: Diagnosis not present

## 2022-03-19 DIAGNOSIS — H40061 Primary angle closure without glaucoma damage, right eye: Secondary | ICD-10-CM | POA: Diagnosis not present

## 2022-03-19 DIAGNOSIS — H25811 Combined forms of age-related cataract, right eye: Secondary | ICD-10-CM | POA: Diagnosis not present

## 2022-06-07 DIAGNOSIS — R809 Proteinuria, unspecified: Secondary | ICD-10-CM | POA: Diagnosis not present

## 2022-06-07 DIAGNOSIS — E1129 Type 2 diabetes mellitus with other diabetic kidney complication: Secondary | ICD-10-CM | POA: Diagnosis not present

## 2022-06-14 DIAGNOSIS — R809 Proteinuria, unspecified: Secondary | ICD-10-CM | POA: Diagnosis not present

## 2022-06-14 DIAGNOSIS — Z6835 Body mass index (BMI) 35.0-35.9, adult: Secondary | ICD-10-CM | POA: Diagnosis not present

## 2022-06-14 DIAGNOSIS — E1129 Type 2 diabetes mellitus with other diabetic kidney complication: Secondary | ICD-10-CM | POA: Diagnosis not present

## 2022-06-14 DIAGNOSIS — I10 Essential (primary) hypertension: Secondary | ICD-10-CM | POA: Diagnosis not present

## 2022-08-29 DIAGNOSIS — H40063 Primary angle closure without glaucoma damage, bilateral: Secondary | ICD-10-CM | POA: Diagnosis not present

## 2022-09-26 DIAGNOSIS — R809 Proteinuria, unspecified: Secondary | ICD-10-CM | POA: Diagnosis not present

## 2022-09-26 DIAGNOSIS — E1129 Type 2 diabetes mellitus with other diabetic kidney complication: Secondary | ICD-10-CM | POA: Diagnosis not present

## 2022-10-03 DIAGNOSIS — Z0000 Encounter for general adult medical examination without abnormal findings: Secondary | ICD-10-CM | POA: Diagnosis not present

## 2022-10-03 DIAGNOSIS — R809 Proteinuria, unspecified: Secondary | ICD-10-CM | POA: Diagnosis not present

## 2022-10-03 DIAGNOSIS — E1129 Type 2 diabetes mellitus with other diabetic kidney complication: Secondary | ICD-10-CM | POA: Diagnosis not present

## 2022-10-03 DIAGNOSIS — Z1331 Encounter for screening for depression: Secondary | ICD-10-CM | POA: Diagnosis not present

## 2022-10-09 DIAGNOSIS — Z01419 Encounter for gynecological examination (general) (routine) without abnormal findings: Secondary | ICD-10-CM | POA: Diagnosis not present

## 2022-10-09 DIAGNOSIS — Z1231 Encounter for screening mammogram for malignant neoplasm of breast: Secondary | ICD-10-CM | POA: Diagnosis not present

## 2022-10-09 DIAGNOSIS — N958 Other specified menopausal and perimenopausal disorders: Secondary | ICD-10-CM | POA: Diagnosis not present

## 2022-10-09 DIAGNOSIS — Z6835 Body mass index (BMI) 35.0-35.9, adult: Secondary | ICD-10-CM | POA: Diagnosis not present

## 2022-11-06 DIAGNOSIS — L299 Pruritus, unspecified: Secondary | ICD-10-CM | POA: Diagnosis not present

## 2022-11-06 DIAGNOSIS — R21 Rash and other nonspecific skin eruption: Secondary | ICD-10-CM | POA: Diagnosis not present

## 2022-11-06 DIAGNOSIS — E119 Type 2 diabetes mellitus without complications: Secondary | ICD-10-CM | POA: Diagnosis not present

## 2022-11-06 DIAGNOSIS — L255 Unspecified contact dermatitis due to plants, except food: Secondary | ICD-10-CM | POA: Diagnosis not present

## 2023-01-15 DIAGNOSIS — K080 Exfoliation of teeth due to systemic causes: Secondary | ICD-10-CM | POA: Diagnosis not present

## 2023-01-16 DIAGNOSIS — E1129 Type 2 diabetes mellitus with other diabetic kidney complication: Secondary | ICD-10-CM | POA: Diagnosis not present

## 2023-01-16 DIAGNOSIS — R809 Proteinuria, unspecified: Secondary | ICD-10-CM | POA: Diagnosis not present

## 2023-01-23 DIAGNOSIS — R809 Proteinuria, unspecified: Secondary | ICD-10-CM | POA: Diagnosis not present

## 2023-01-23 DIAGNOSIS — Z6833 Body mass index (BMI) 33.0-33.9, adult: Secondary | ICD-10-CM | POA: Diagnosis not present

## 2023-01-23 DIAGNOSIS — E1129 Type 2 diabetes mellitus with other diabetic kidney complication: Secondary | ICD-10-CM | POA: Diagnosis not present

## 2023-01-23 DIAGNOSIS — M79672 Pain in left foot: Secondary | ICD-10-CM | POA: Diagnosis not present

## 2023-09-26 DIAGNOSIS — H524 Presbyopia: Secondary | ICD-10-CM | POA: Diagnosis not present

## 2023-10-02 DIAGNOSIS — E1129 Type 2 diabetes mellitus with other diabetic kidney complication: Secondary | ICD-10-CM | POA: Diagnosis not present

## 2023-10-02 DIAGNOSIS — R809 Proteinuria, unspecified: Secondary | ICD-10-CM | POA: Diagnosis not present

## 2023-10-08 DIAGNOSIS — Z23 Encounter for immunization: Secondary | ICD-10-CM | POA: Diagnosis not present

## 2023-10-08 DIAGNOSIS — Z6834 Body mass index (BMI) 34.0-34.9, adult: Secondary | ICD-10-CM | POA: Diagnosis not present

## 2023-10-08 DIAGNOSIS — Z0000 Encounter for general adult medical examination without abnormal findings: Secondary | ICD-10-CM | POA: Diagnosis not present

## 2023-10-08 DIAGNOSIS — E1122 Type 2 diabetes mellitus with diabetic chronic kidney disease: Secondary | ICD-10-CM | POA: Diagnosis not present

## 2023-10-08 DIAGNOSIS — N183 Chronic kidney disease, stage 3 unspecified: Secondary | ICD-10-CM | POA: Diagnosis not present

## 2023-11-11 DIAGNOSIS — K080 Exfoliation of teeth due to systemic causes: Secondary | ICD-10-CM | POA: Diagnosis not present

## 2024-01-02 DIAGNOSIS — Z6833 Body mass index (BMI) 33.0-33.9, adult: Secondary | ICD-10-CM | POA: Diagnosis not present

## 2024-01-02 DIAGNOSIS — Z1272 Encounter for screening for malignant neoplasm of vagina: Secondary | ICD-10-CM | POA: Diagnosis not present

## 2024-01-02 DIAGNOSIS — Z01419 Encounter for gynecological examination (general) (routine) without abnormal findings: Secondary | ICD-10-CM | POA: Diagnosis not present

## 2024-01-02 DIAGNOSIS — Z1231 Encounter for screening mammogram for malignant neoplasm of breast: Secondary | ICD-10-CM | POA: Diagnosis not present

## 2024-02-04 DIAGNOSIS — J329 Chronic sinusitis, unspecified: Secondary | ICD-10-CM | POA: Diagnosis not present

## 2024-02-04 DIAGNOSIS — J40 Bronchitis, not specified as acute or chronic: Secondary | ICD-10-CM | POA: Diagnosis not present

## 2024-03-25 DIAGNOSIS — R809 Proteinuria, unspecified: Secondary | ICD-10-CM | POA: Diagnosis not present

## 2024-03-25 DIAGNOSIS — E1129 Type 2 diabetes mellitus with other diabetic kidney complication: Secondary | ICD-10-CM | POA: Diagnosis not present

## 2024-04-01 DIAGNOSIS — I10 Essential (primary) hypertension: Secondary | ICD-10-CM | POA: Diagnosis not present

## 2024-04-01 DIAGNOSIS — E1122 Type 2 diabetes mellitus with diabetic chronic kidney disease: Secondary | ICD-10-CM | POA: Diagnosis not present

## 2024-04-01 DIAGNOSIS — Z6834 Body mass index (BMI) 34.0-34.9, adult: Secondary | ICD-10-CM | POA: Diagnosis not present

## 2024-04-01 DIAGNOSIS — N183 Chronic kidney disease, stage 3 unspecified: Secondary | ICD-10-CM | POA: Diagnosis not present

## 2024-09-22 DIAGNOSIS — E1129 Type 2 diabetes mellitus with other diabetic kidney complication: Secondary | ICD-10-CM | POA: Diagnosis not present

## 2024-09-22 DIAGNOSIS — E782 Mixed hyperlipidemia: Secondary | ICD-10-CM | POA: Diagnosis not present

## 2024-09-22 DIAGNOSIS — R809 Proteinuria, unspecified: Secondary | ICD-10-CM | POA: Diagnosis not present

## 2024-09-29 DIAGNOSIS — R809 Proteinuria, unspecified: Secondary | ICD-10-CM | POA: Diagnosis not present

## 2024-09-29 DIAGNOSIS — E1129 Type 2 diabetes mellitus with other diabetic kidney complication: Secondary | ICD-10-CM | POA: Diagnosis not present

## 2024-09-29 DIAGNOSIS — Z23 Encounter for immunization: Secondary | ICD-10-CM | POA: Diagnosis not present

## 2024-09-29 DIAGNOSIS — Z6834 Body mass index (BMI) 34.0-34.9, adult: Secondary | ICD-10-CM | POA: Diagnosis not present

## 2024-10-13 DIAGNOSIS — Z0000 Encounter for general adult medical examination without abnormal findings: Secondary | ICD-10-CM | POA: Diagnosis not present
